# Patient Record
Sex: Female | Born: 1995 | Race: White | Hispanic: No | Marital: Single | Smoking: Never smoker
Health system: Southern US, Community
[De-identification: ages and names within clinical notes are randomized; demographics above are authoritative.]

## PROBLEM LIST (undated history)

## (undated) DIAGNOSIS — Z87442 Personal history of urinary calculi: Secondary | ICD-10-CM

## (undated) DIAGNOSIS — E119 Type 2 diabetes mellitus without complications: Secondary | ICD-10-CM

## (undated) HISTORY — PX: HERNIA REPAIR: SHX51

## (undated) HISTORY — PX: TYMPANOSTOMY TUBE PLACEMENT: SHX32

---
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
## 2018-01-04 ENCOUNTER — Other Ambulatory Visit: Payer: Self-pay | Admitting: Orthopedic Surgery

## 2018-01-04 DIAGNOSIS — M25552 Pain in left hip: Secondary | ICD-10-CM

## 2018-01-04 DIAGNOSIS — G8929 Other chronic pain: Secondary | ICD-10-CM

## 2018-01-04 DIAGNOSIS — M25551 Pain in right hip: Secondary | ICD-10-CM

## 2018-01-04 DIAGNOSIS — M25562 Pain in left knee: Secondary | ICD-10-CM

## 2018-01-04 DIAGNOSIS — M25561 Pain in right knee: Secondary | ICD-10-CM

## 2018-01-29 ENCOUNTER — Ambulatory Visit
Admission: RE | Admit: 2018-01-29 | Discharge: 2018-01-29 | Disposition: A | Discharge status: Home / Self Care | Payer: Medicaid Other | Source: Ambulatory Visit | Attending: Orthopedic Surgery | Admitting: Orthopedic Surgery

## 2018-01-29 ENCOUNTER — Encounter: Payer: Self-pay | Admitting: Radiology

## 2018-01-29 DIAGNOSIS — M25551 Pain in right hip: Secondary | ICD-10-CM | POA: Insufficient documentation

## 2018-01-29 DIAGNOSIS — M25552 Pain in left hip: Secondary | ICD-10-CM | POA: Diagnosis not present

## 2018-01-29 DIAGNOSIS — M25561 Pain in right knee: Secondary | ICD-10-CM

## 2018-01-29 DIAGNOSIS — G8929 Other chronic pain: Secondary | ICD-10-CM

## 2018-01-29 DIAGNOSIS — M25562 Pain in left knee: Secondary | ICD-10-CM

## 2018-01-29 DIAGNOSIS — M7121 Synovial cyst of popliteal space [Baker], right knee: Secondary | ICD-10-CM | POA: Insufficient documentation

## 2018-03-03 ENCOUNTER — Ambulatory Visit
Admission: RE | Admit: 2018-03-03 | Discharge: 2018-03-03 | Disposition: A | Discharge status: Home / Self Care | Payer: Medicaid Other | Source: Ambulatory Visit | Attending: Orthopedic Surgery | Admitting: Orthopedic Surgery

## 2018-03-03 ENCOUNTER — Other Ambulatory Visit: Payer: Self-pay | Admitting: Orthopedic Surgery

## 2018-03-03 DIAGNOSIS — M25561 Pain in right knee: Secondary | ICD-10-CM | POA: Insufficient documentation

## 2018-03-03 DIAGNOSIS — M2390 Unspecified internal derangement of unspecified knee: Secondary | ICD-10-CM

## 2018-03-03 DIAGNOSIS — M25562 Pain in left knee: Secondary | ICD-10-CM | POA: Diagnosis present

## 2018-03-09 ENCOUNTER — Encounter
Admission: RE | Admit: 2018-03-09 | Discharge: 2018-03-09 | Disposition: A | Discharge status: Home / Self Care | Payer: Medicaid Other | Source: Ambulatory Visit | Attending: Orthopedic Surgery | Admitting: Orthopedic Surgery

## 2018-03-09 ENCOUNTER — Other Ambulatory Visit: Payer: Self-pay

## 2018-03-09 HISTORY — DX: Type 2 diabetes mellitus without complications: E11.9

## 2018-03-09 HISTORY — DX: Personal history of urinary calculi: Z87.442

## 2018-03-09 NOTE — Patient Instructions (Signed)
Your procedure is scheduled on: 03-15-18 MONDAY Report to Same Day Surgery 2nd floor medical mall [facility](Medical Mall Entrance-take elevator on left to 2nd floor.  Check in with surgery information desk.) To find out your arrival time please call [phone] between 1PM - 3PM on 03-12-18 FRIDAY  Remember: Instructions that are not followed completely may result in serious medical risk, up to and including death, or upon the discretion of your surgeon and anesthesiologist your surgery may need to be rescheduled.    _x___ 1. Do not eat food after midnight the night before your procedure. You may drink WATER up to 2 hours before you are scheduled to arrive at the hospital for your procedure.  Do not drink WATER within 2 hours of your scheduled arrival to the hospital.  Type 1 and type 2 diabetics should only drink water.    __x__ 2. No Alcohol for 24 hours before or after surgery.   __x__3. No Smoking or e-cigarettes for 24 prior to surgery.  Do not use any chewable tobacco products for at least 6 hour prior to surgery   ____  4. Bring all medications with you on the day of surgery if instructed.    __x__ 5. Notify your doctor if there is any change in your medical condition     (cold, fever, infections).    x___6. On the morning of surgery brush your teeth with toothpaste and water.  You may rinse your mouth with mouth wash if you wish.  Do not swallow any toothpaste or mouthwash.   Do not wear jewelry, make-up, hairpins, clips or nail polish.  Do not wear lotions, powders, or perfumes. You may wear deodorant.  Do not shave 48 hours prior to surgery. Men may shave face and neck.  Do not bring valuables to the hospital.    [facility] not responsible for any belongings or valuables.               Contacts, dentures or bridgework may not be worn into surgery.  Leave your suitcase in the car. After surgery it may be brought to your room.  For patients admitted to the hospital, discharge time  is determined by your treatment team.  _  Patients discharged the day of surgery will not be allowed to drive home.  You will need someone to drive you home and stay with you the night of your procedure.    Please read over the following fact sheets that you were given:   [facility] Preparing for [facility] and or MRSA Information   ____ Take anti-hypertensive listed below, cardiac, seizure, asthma, anti-reflux and psychiatric medicines. These include:  1. NONE  2.  3.  4.  5.  6.  ____Fleets enema or Magnesium Citrate as directed.   _x___ Use CHG Soap or sage wipes as directed on instruction sheet   ____ Use inhalers on the day of surgery and bring to hospital day of surgery  _X___ Stop Metformin 2 days prior to surgery-LAST DOSE ON Friday, 03-12-18    ____ Take 1/2 of usual insulin dose the night before surgery and none on the morning surgery.   ____ Follow recommendations from Cardiologist, Pulmonologist or PCP regarding  stopping Aspirin, Coumadin, Plavix ,Eliquis, Effient, or Pradaxa, and Pletal.  X____Stop Anti-inflammatories such as Advil, Aleve, IBUPROFEN, Motrin, Naproxen, Naprosyn, Goodies powders or aspirin products NOW-OK to take Tylenol    ____ Stop supplements until after surgery.     ____ Bring C-Pap to  the hospital.

## 2018-03-10 ENCOUNTER — Encounter
Admission: RE | Admit: 2018-03-10 | Discharge: 2018-03-10 | Disposition: A | Discharge status: Home / Self Care | Payer: Medicaid Other | Source: Ambulatory Visit | Attending: Orthopedic Surgery | Admitting: Orthopedic Surgery

## 2018-03-10 DIAGNOSIS — Z01812 Encounter for preprocedural laboratory examination: Secondary | ICD-10-CM | POA: Insufficient documentation

## 2018-03-10 LAB — BASIC METABOLIC PANEL
ANION GAP: 11 (ref 5–15)
BUN: 12 mg/dL (ref 6–20)
CALCIUM: 9.3 mg/dL (ref 8.9–10.3)
CO2: 20 mmol/L — ABNORMAL LOW (ref 22–32)
Chloride: 107 mmol/L (ref 101–111)
Creatinine, Ser: 0.73 mg/dL (ref 0.44–1.00)
GFR calc Af Amer: >60 mL/min (ref >60)
Glucose, Bld: 88 mg/dL (ref 65–99)
POTASSIUM: 4.7 mmol/L (ref 3.5–5.1)
SODIUM: 138 mmol/L (ref 135–145)

## 2018-03-14 MED ORDER — CEFAZOLIN SODIUM-DEXTROSE 2-4 GM/100ML-% IV SOLN
2.0000 g | Freq: Once | INTRAVENOUS | Status: AC
  Administered 2018-03-15: 2 g via INTRAVENOUS

## 2018-03-15 ENCOUNTER — Ambulatory Visit: Payer: Medicaid Other | Admitting: Certified Registered"

## 2018-03-15 ENCOUNTER — Ambulatory Visit: Payer: Medicaid Other

## 2018-03-15 ENCOUNTER — Encounter
Admission: RE | Disposition: A | Discharge status: Home / Self Care | Payer: Self-pay | Source: Ambulatory Visit | Attending: Orthopedic Surgery

## 2018-03-15 ENCOUNTER — Ambulatory Visit
Admission: RE | Admit: 2018-03-15 | Discharge: 2018-03-15 | Disposition: A | Discharge status: Home / Self Care | Payer: Medicaid Other | Source: Ambulatory Visit | Attending: Orthopedic Surgery | Admitting: Orthopedic Surgery

## 2018-03-15 ENCOUNTER — Encounter: Payer: Self-pay | Admitting: Anesthesiology

## 2018-03-15 DIAGNOSIS — Z419 Encounter for procedure for purposes other than remedying health state, unspecified: Secondary | ICD-10-CM

## 2018-03-15 DIAGNOSIS — M60262 Foreign body granuloma of soft tissue, not elsewhere classified, left lower leg: Secondary | ICD-10-CM | POA: Diagnosis not present

## 2018-03-15 DIAGNOSIS — E119 Type 2 diabetes mellitus without complications: Secondary | ICD-10-CM | POA: Insufficient documentation

## 2018-03-15 HISTORY — PX: INCISION AND DRAINAGE WOUND WITH FOREIGN BODY REMOVAL: SHX5635

## 2018-03-15 LAB — GLUCOSE, CAPILLARY
GLUCOSE-CAPILLARY: 80 mg/dL (ref 65–99)
Glucose-Capillary: 90 mg/dL (ref 65–99)

## 2018-03-15 LAB — POCT PREGNANCY, URINE: Preg Test, Ur: NEGATIVE

## 2018-03-15 SURGERY — INCISION AND DRAINAGE WOUND WITH FOREIGN BODY REMOVAL
Anesthesia: General | Site: Knee | Laterality: Left | Wound class: Clean

## 2018-03-15 MED ORDER — GLYCOPYRROLATE 0.2 MG/ML IJ SOLN
INTRAMUSCULAR | Status: DC | PRN
  Administered 2018-03-15: 0.2 mg via INTRAVENOUS

## 2018-03-15 MED ORDER — ONDANSETRON HCL 4 MG/2ML IJ SOLN: 4.0000 mg | Freq: Once | INTRAMUSCULAR | Status: DC | PRN

## 2018-03-15 MED ORDER — FAMOTIDINE 20 MG PO TABS
20.0000 mg | ORAL_TABLET | Freq: Once | ORAL | Status: AC
  Administered 2018-03-15: 20 mg via ORAL

## 2018-03-15 MED ORDER — LACTATED RINGERS IV SOLN
INTRAVENOUS | Status: DC | PRN
Start: 2018-03-15 — End: 2018-03-15
  Administered 2018-03-15 (×2): via INTRAVENOUS

## 2018-03-15 MED ORDER — MIDAZOLAM HCL 2 MG/2ML IJ SOLN
INTRAMUSCULAR | Status: AC
  Filled 2018-03-15: qty 2

## 2018-03-15 MED ORDER — ONDANSETRON 4 MG PO TBDP: 4.0000 mg | ORAL_TABLET | Freq: Three times a day (TID) | ORAL | 0 refills | Status: AC | PRN

## 2018-03-15 MED ORDER — MIDAZOLAM HCL 2 MG/2ML IJ SOLN
INTRAMUSCULAR | Status: DC | PRN
  Administered 2018-03-15: 2 mg via INTRAVENOUS

## 2018-03-15 MED ORDER — KETOROLAC TROMETHAMINE 30 MG/ML IJ SOLN
INTRAMUSCULAR | Status: DC | PRN
  Administered 2018-03-15: 30 mg via INTRAVENOUS

## 2018-03-15 MED ORDER — OXYCODONE HCL 5 MG PO TABS: 5.0000 mg | ORAL_TABLET | ORAL | 0 refills | Status: AC | PRN

## 2018-03-15 MED ORDER — IBUPROFEN 600 MG PO TABS: 600.0000 mg | ORAL_TABLET | Freq: Three times a day (TID) | ORAL | 0 refills | Status: AC | PRN

## 2018-03-15 MED ORDER — FENTANYL CITRATE (PF) 100 MCG/2ML IJ SOLN
INTRAMUSCULAR | Status: AC
  Filled 2018-03-15: qty 2

## 2018-03-15 MED ORDER — PROPOFOL 10 MG/ML IV BOLUS
INTRAVENOUS | Status: AC
  Filled 2018-03-15: qty 20

## 2018-03-15 MED ORDER — ONDANSETRON HCL 4 MG/2ML IJ SOLN
INTRAMUSCULAR | Status: DC | PRN
  Administered 2018-03-15: 4 mg via INTRAVENOUS

## 2018-03-15 MED ORDER — FAMOTIDINE 20 MG PO TABS
ORAL_TABLET | ORAL | Status: AC
  Administered 2018-03-15: 20 mg via ORAL
  Filled 2018-03-15: qty 1

## 2018-03-15 MED ORDER — HYDROMORPHONE HCL 1 MG/ML IJ SOLN
INTRAMUSCULAR | Status: DC | PRN
  Administered 2018-03-15: 1 mg via INTRAVENOUS

## 2018-03-15 MED ORDER — BUPIVACAINE-EPINEPHRINE (PF) 0.5% -1:200000 IJ SOLN
INTRAMUSCULAR | Status: DC | PRN
  Administered 2018-03-15: 5 mL

## 2018-03-15 MED ORDER — FENTANYL CITRATE (PF) 100 MCG/2ML IJ SOLN
25.0000 ug | INTRAMUSCULAR | Status: DC | PRN
  Administered 2018-03-15: 25 ug via INTRAVENOUS

## 2018-03-15 MED ORDER — ACETAMINOPHEN 500 MG PO TABS: 1000.0000 mg | ORAL_TABLET | Freq: Three times a day (TID) | ORAL | 0 refills | Status: AC

## 2018-03-15 MED ORDER — ASPIRIN EC 325 MG PO TBEC: 325.0000 mg | DELAYED_RELEASE_TABLET | Freq: Every day | ORAL | 0 refills | Status: AC

## 2018-03-15 MED ORDER — FENTANYL CITRATE (PF) 100 MCG/2ML IJ SOLN
INTRAMUSCULAR | Status: DC | PRN
  Administered 2018-03-15: 100 ug via INTRAVENOUS

## 2018-03-15 MED ORDER — LIDOCAINE HCL (CARDIAC) 20 MG/ML IV SOLN
INTRAVENOUS | Status: DC | PRN
  Administered 2018-03-15: 60 mg via INTRAVENOUS

## 2018-03-15 MED ORDER — SODIUM CHLORIDE 0.9 % IV SOLN
INTRAVENOUS | Status: DC
  Administered 2018-03-15: 11:00:00 via INTRAVENOUS

## 2018-03-15 MED ORDER — BUPIVACAINE-EPINEPHRINE (PF) 0.5% -1:200000 IJ SOLN
INTRAMUSCULAR | Status: AC
  Filled 2018-03-15: qty 30

## 2018-03-15 MED ORDER — HYDROMORPHONE HCL 1 MG/ML IJ SOLN
INTRAMUSCULAR | Status: AC
  Filled 2018-03-15: qty 1

## 2018-03-15 MED ORDER — PROPOFOL 10 MG/ML IV BOLUS
INTRAVENOUS | Status: DC | PRN
  Administered 2018-03-15: 50 mg via INTRAVENOUS
  Administered 2018-03-15: 150 mg via INTRAVENOUS

## 2018-03-15 MED ORDER — LIDOCAINE-EPINEPHRINE 1 %-1:100000 IJ SOLN
INTRAMUSCULAR | Status: AC
  Filled 2018-03-15: qty 1

## 2018-03-15 SURGICAL SUPPLY — 23 items
BANDAGE ELASTIC 6 LF NS (GAUZE/BANDAGES/DRESSINGS) ×2 IMPLANT
BNDG ESMARK 4X12 TAN STRL LF (GAUZE/BANDAGES/DRESSINGS) ×2 IMPLANT
CANISTER SUCT 1200ML W/VALVE (MISCELLANEOUS) ×2 IMPLANT
CHLORAPREP W/TINT 26ML (MISCELLANEOUS) ×2 IMPLANT
CUFF TOURN 30 STER DUAL PORT (MISCELLANEOUS) ×2 IMPLANT
DERMABOND ADVANCED (GAUZE/BANDAGES/DRESSINGS) ×1
DERMABOND ADVANCED .7 DNX12 (GAUZE/BANDAGES/DRESSINGS) ×1 IMPLANT
GLOVE BIO SURGEON STRL SZ8 (GLOVE) ×4 IMPLANT
GLOVE INDICATOR 8.0 STRL GRN (GLOVE) ×4 IMPLANT
GOWN STRL REUS W/ TWL LRG LVL3 (GOWN DISPOSABLE) ×1 IMPLANT
GOWN STRL REUS W/ TWL XL LVL3 (GOWN DISPOSABLE) ×1 IMPLANT
GOWN STRL REUS W/TWL LRG LVL3 (GOWN DISPOSABLE) ×1
GOWN STRL REUS W/TWL XL LVL3 (GOWN DISPOSABLE) ×1
KIT TURNOVER KIT A (KITS) ×2 IMPLANT
NS IRRIG 500ML POUR BTL (IV SOLUTION) ×2 IMPLANT
PACK EXTREMITY ARMC (MISCELLANEOUS) ×2 IMPLANT
PAD CAST CTTN 4X4 STRL (SOFTGOODS) ×1 IMPLANT
PADDING CAST COTTON 4X4 STRL (SOFTGOODS) ×1
STRAP SAFETY 5IN WIDE (MISCELLANEOUS) ×2 IMPLANT
SUT MNCRL AB 4-0 PS2 18 (SUTURE) ×2 IMPLANT
SUT MON AB 4-0 RB1 27 (SUTURE) ×2 IMPLANT
SUT VIC AB 3-0 SH 27 (SUTURE) ×1
SUT VIC AB 3-0 SH 27X BRD (SUTURE) ×1 IMPLANT

## 2018-03-15 NOTE — Anesthesia Postprocedure Evaluation (Signed)
Anesthesia Post Note  Patient: Tamara Kim  Procedure(s) Performed: FOREIGN BODY REMOVAL-LEFT KNEE (Left Knee)  Patient location during evaluation: PACU Anesthesia Type: General Level of consciousness: awake and alert Pain management: pain level controlled Vital Signs Assessment: post-procedure vital signs reviewed and stable Respiratory status: spontaneous breathing, nonlabored ventilation, respiratory function stable and patient connected to nasal cannula oxygen Cardiovascular status: blood pressure returned to baseline and stable Postop Assessment: no apparent nausea or vomiting Anesthetic complications: no     Last Vitals:  Vitals:   03/15/18 1510 03/15/18 1530  BP: 106/67 103/70  Pulse: 69   Resp: 10 14  Temp: 36.5 C 36.9 C  SpO2: 95% 98%    Last Pain:  Vitals:   03/15/18 1518  TempSrc:   PainSc: Asleep                 Kiel Cockerell S

## 2018-03-15 NOTE — Anesthesia Procedure Notes (Signed)
Procedure Name: LMA Insertion Date/Time: 03/15/2018 1:22 PM Performed by: Maryla MorrowFerencik, Kori Colin M., CRNA Pre-anesthesia Checklist: Patient identified, Patient being monitored, Timeout performed, Emergency Drugs available and Suction available Patient Re-evaluated:Patient Re-evaluated prior to induction Oxygen Delivery Method: Circle system utilized Preoxygenation: Pre-oxygenation with 100% oxygen Induction Type: IV induction Ventilation: Mask ventilation without difficulty LMA: LMA inserted LMA Size: 4.5 Tube type: Oral Number of attempts: 1 Placement Confirmation: positive ETCO2 and breath sounds checked- equal and bilateral Tube secured with: Tape Dental Injury: Teeth and Oropharynx as per pre-operative assessment

## 2018-03-15 NOTE — Transfer of Care (Signed)
Immediate Anesthesia Transfer of Care Note  Patient: Tamara Kim  Procedure(s) Performed: FOREIGN BODY REMOVAL-LEFT KNEE (Left Knee)  Patient Location: PACU  Anesthesia Type:General  Level of Consciousness: awake, alert  and oriented  Airway & Oxygen Therapy: Patient Spontanous Breathing  Post-op Assessment: Report given to RN and Post -op Vital signs reviewed and stable  Post vital signs: Reviewed and stable  Last Vitals:  Vitals:   03/15/18 1113  BP: 116/74  Pulse: 81  Resp: 17  Temp: 36.8 C  SpO2: 100%    Last Pain:  Vitals:   03/15/18 1113  TempSrc: Oral  PainSc: 5          Complications: No apparent anesthesia complications
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
## 2018-03-15 NOTE — OR Nursing (Signed)
Discussed discharge instructions with pt and mom. Both voice understanding 

## 2018-03-15 NOTE — Anesthesia Preprocedure Evaluation (Signed)
Anesthesia Evaluation  Patient identified by MRN, date of birth, ID band Patient awake    Reviewed: Allergy & Precautions, NPO status , Patient's Chart, lab work & pertinent test results, reviewed documented beta blocker date and time   Airway Mallampati: II  TM Distance: >3 FB     Dental  (+) Chipped   Pulmonary           Cardiovascular      Neuro/Psych    GI/Hepatic   Endo/Other  diabetes, Type 2  Renal/GU      Musculoskeletal   Abdominal   Peds  Hematology   Anesthesia Other Findings   Reproductive/Obstetrics                             Anesthesia Physical Anesthesia Plan  ASA: II  Anesthesia Plan: General   Post-op Pain Management:    Induction: Intravenous  PONV Risk Score and Plan:   Airway Management Planned: LMA  Additional Equipment:   Intra-op Plan:   Post-operative Plan:   Informed Consent: I have reviewed the patients History and Physical, chart, labs and discussed the procedure including the risks, benefits and alternatives for the proposed anesthesia with the patient or authorized representative who has indicated his/her understanding and acceptance.     Plan Discussed with: CRNA  Anesthesia Plan Comments:         Anesthesia Quick Evaluation

## 2018-03-15 NOTE — Anesthesia Post-op Follow-up Note (Signed)
Anesthesia QCDR form completed.        

## 2018-03-15 NOTE — Discharge Instructions (Signed)
Knee Surgery  Post-Op Instructions  1. Bracing or crutches: Crutches will be provided at the time of discharge from the surgery center if you need them and do not already have them.  2. Ice: You may be provided with a device [facility](Polar Care) that allows you to ice the affected area effectively. Otherwise you can ice manually.   3. Driving:  Plan on not driving for at least one week. Please note that you are advised NOT to drive while taking narcotic pain medications as you may be impaired and unsafe to drive.  4. Activity: Ankle pumps several times an hour while awake to prevent blood clots. Weight bearing: as tolerated. Use crutches for as needed (usually ~3 days or less) until pain allows you to ambulate without a limp. Bending and straightening the knee is unlimited. Elevate knee above heart level as much as possible for one week. Avoid standing more than 5 minutes (consecutively) for the first few days.  Avoid long distance travel for 1 wee.  5. Medications:  - You have been provided a prescription for narcotic pain medicine. After surgery, take 1 narcotic tablet every 4-6 hours as needed. You should not need this for more than 1-2 days. - A prescription for anti-nausea medication will be provided in case the narcotic medicine causes nausea - take 1 tablet every 6 hours only if nauseated.  - Take ibuprofen 600 mg every 8 hours WITH food to reduce post-operative pain and swelling. Please discontinue if you have any abdominal discomfort after taking this.  - Take Tylenol (acetaminophen) 1000mg  q8h until you no longer have pain. - Take enteric coated aspirin 325 mg once daily for 2 weeks to prevent blood clots.   6. Bandages: The physical therapist should change the bandages at the first post-op appointment. If needed, the dressing supplies have been provided to you.  7. Physical Therapy: 1-2 times per week for 6 weeks. Therapy typically starts on post operative Day 3 or 4. You have been  provided an order for physical therapy. The therapist will provide home exercises.  8. Work: May return to full work usually around 2 weeks after 1st post-operative visit. May do light duty/desk job in approximately 1-2 weeks when off of narcotics, pain is well-controlled, and swelling has decreased. Labor intensive jobs may require 4-6 weeks to return.    9. Post-Op Appointments: Your first post-op appointment will be with Dr. Allena KatzPatel in approximately 2 weeks time.   If you find that they have not been scheduled please call the Orthopaedic Appointment front desk at [phone].    AMBULATORY SURGERY  DISCHARGE INSTRUCTIONS   1) The drugs that you were given will stay in your system until tomorrow so for the next 24 hours you should not:  A) Drive an automobile B) Make any legal decisions C) Drink any alcoholic beverage   2) You may resume regular meals tomorrow.  Today it is better to start with liquids and gradually work up to solid foods.  You may eat anything you prefer, but it is better to start with liquids, then soup and crackers, and gradually work up to solid foods.   3) Please notify your doctor immediately if you have any unusual bleeding, trouble breathing, redness and pain at the surgery site, drainage, fever, or pain not relieved by medication.    4) Additional Instructions:        Please contact your physician with any problems or Same Day Surgery at [phone], Monday through Friday 6  am to 4 pm, or [place] [facility] number at [phone].

## 2018-03-16 ENCOUNTER — Encounter: Payer: Self-pay | Admitting: Orthopedic Surgery

## 2018-03-17 LAB — SURGICAL PATHOLOGY

## 2019-11-24 IMAGING — MR MR KNEE*R* W/O CM
6 series · 38 of 40 positions shown · non-contrast
Comparison: None.

CLINICAL DATA: Chronic right knee pain.

EXAM:
MRI OF THE RIGHT KNEE WITHOUT CONTRAST
TECHNIQUE: Multiplanar, multisequence MR imaging of the knee was performed. No
intravenous contrast was administered.

[Series 3: PD fat-sat · axial · 3.0mm · 0.50mm/px · z∈[-67,+45]mm · 8 of 35 slices shown (1 of 4)]
[im 1/35]
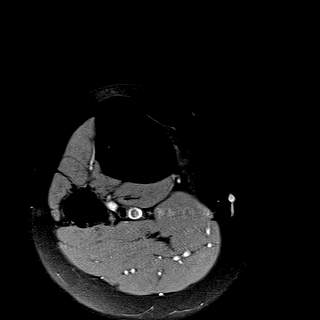
[im 5/35]
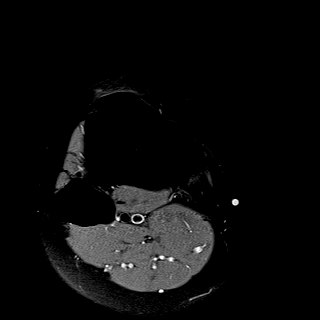
[im 10/35]
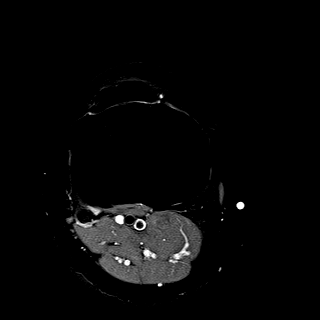
[im 15/35]
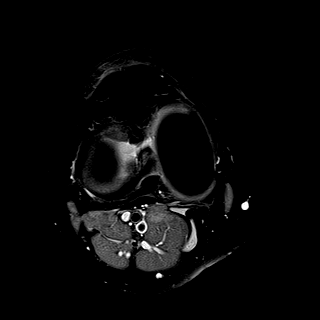
[im 20/35]
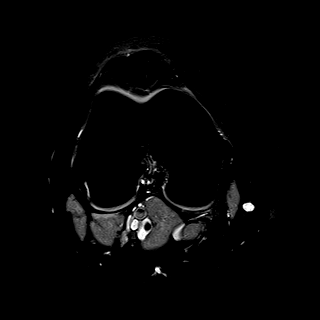
[im 25/35]
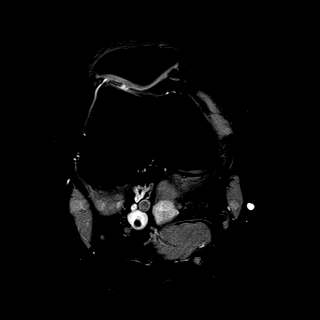
[im 30/35]
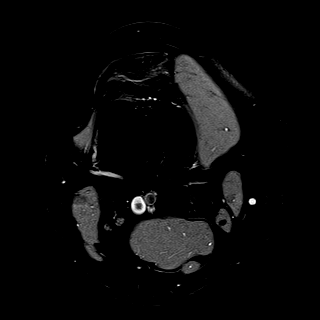
[im 35/35]
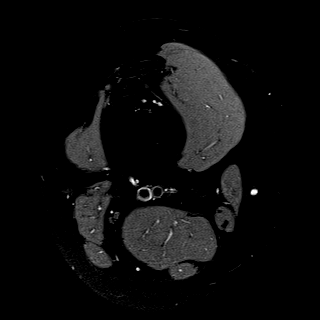

[Series 4: T1 · coronal · 3.0mm · 0.50mm/px · 5 of 29 slices shown]
[im 1/29]
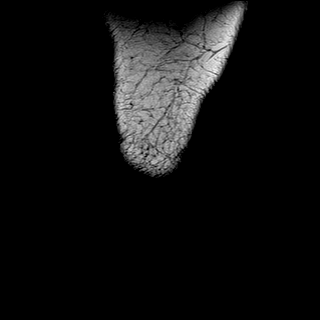
[im 5/29]
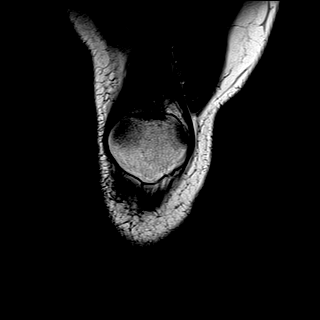
[im 10/29]
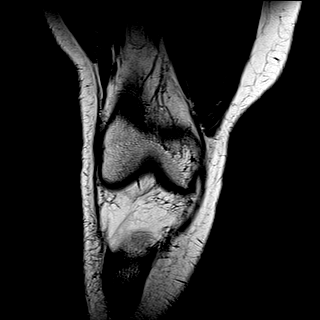
[im 15/29]
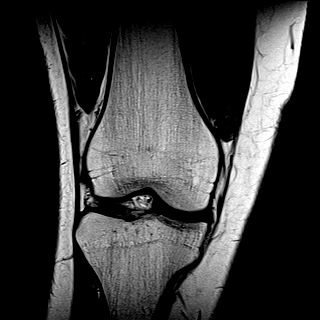
[im 19/29]
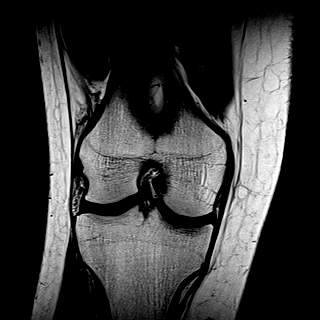

[Series 5: T2 fat-sat · coronal · 3.0mm · 0.31mm/px · 7 of 29 slices shown]
[im 1/29]
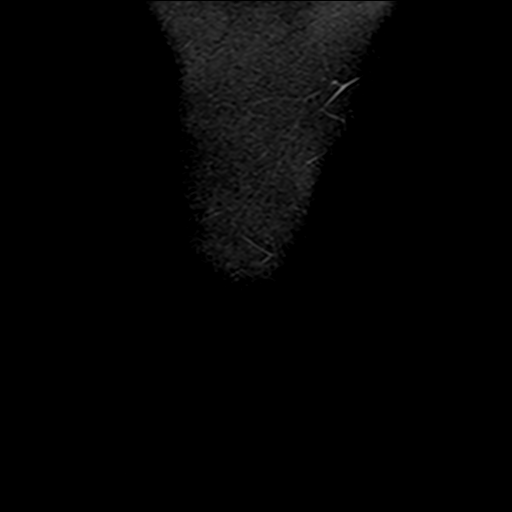
[im 5/29]
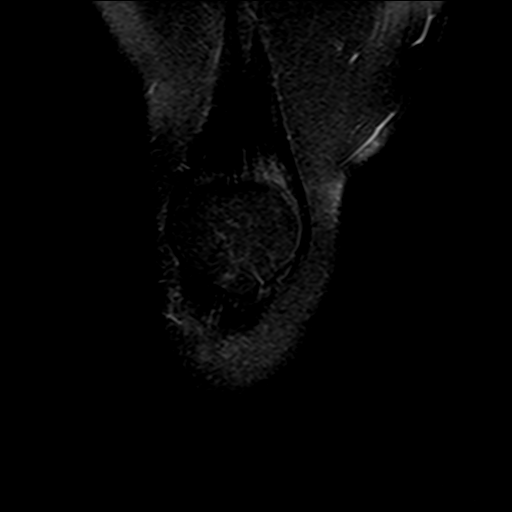
[im 10/29]
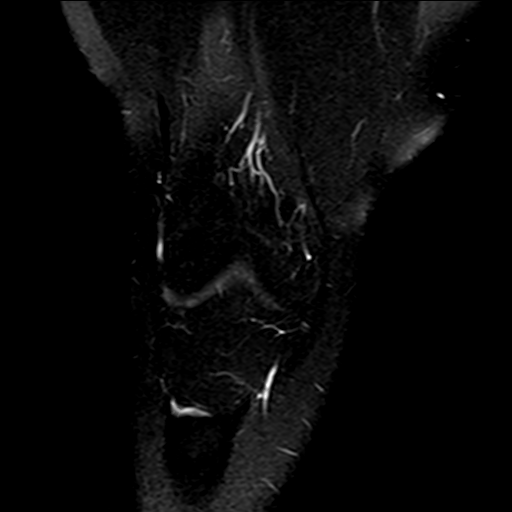
[im 15/29]
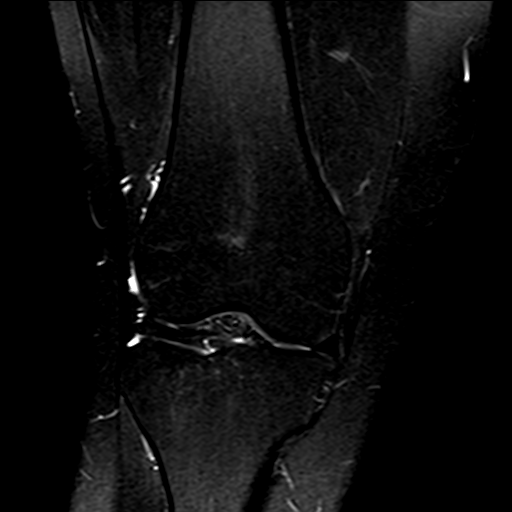
[im 19/29]
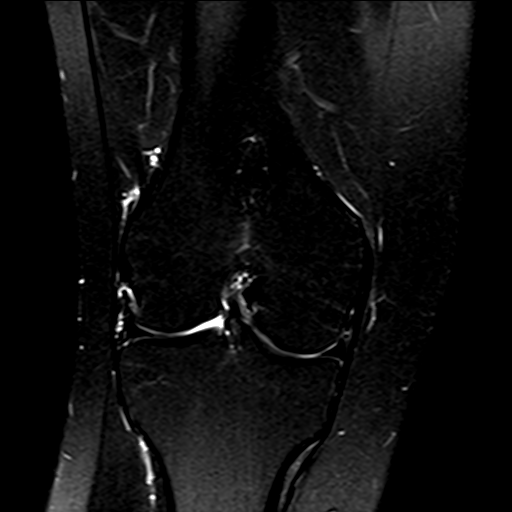
[im 24/29]
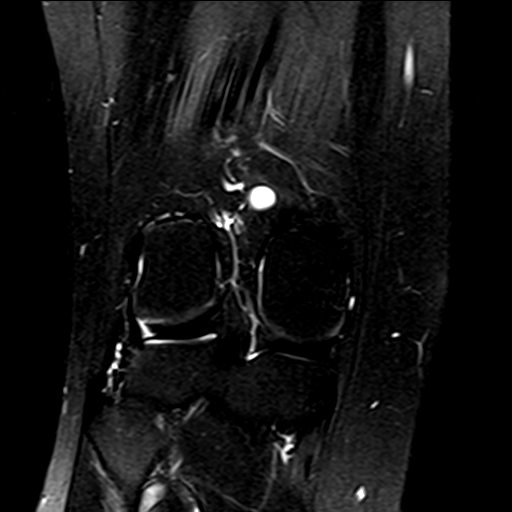
[im 29/29]
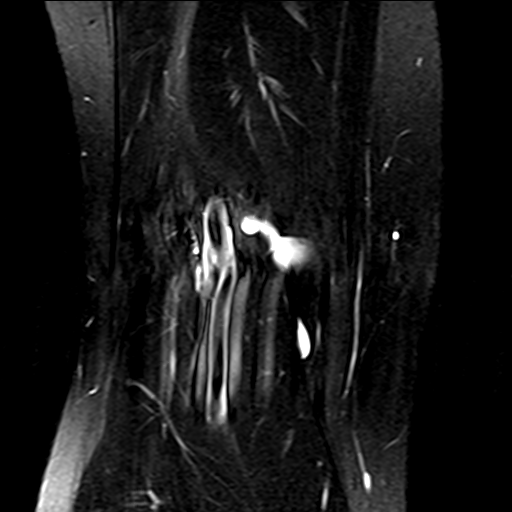

[Series 6: PD fat-sat · sagittal · 3.0mm · 0.50mm/px · 7 of 30 slices shown (2 of 4)]
[im 1/30]
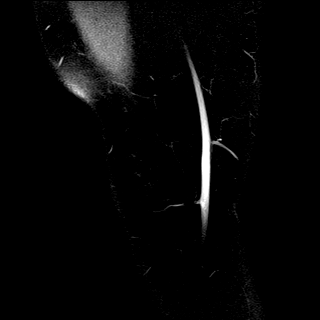
[im 5/30]
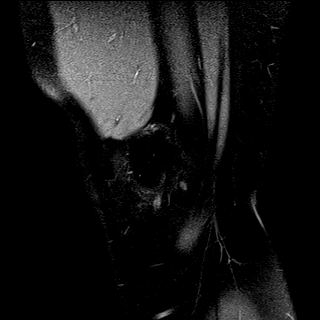
[im 10/30]
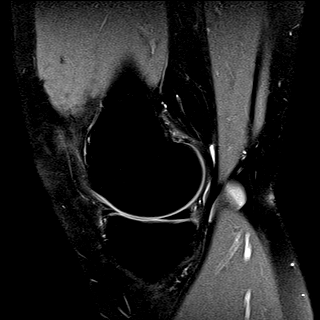
[im 15/30]
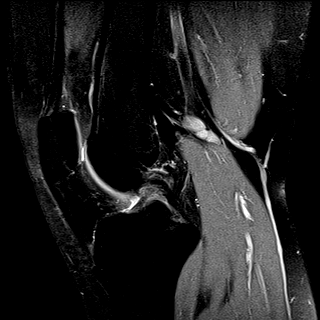
[im 20/30]
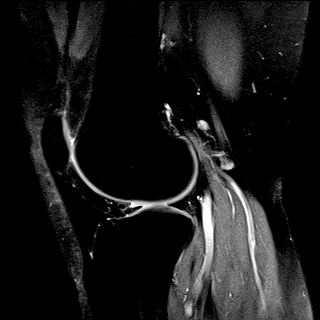
[im 25/30]
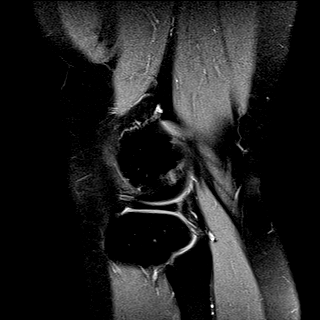
[im 30/30]
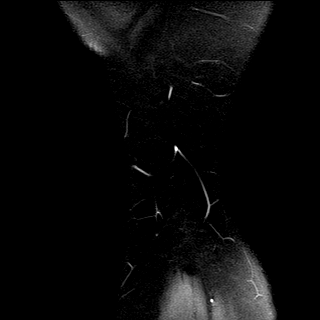

[Series 7: PD fat-sat · coronal · 3.0mm · 0.50mm/px · 7 of 29 slices shown (3 of 4)]
[im 1/29]
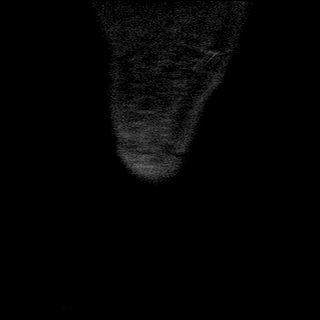
[im 5/29]
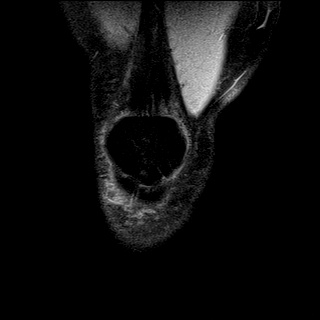
[im 10/29]
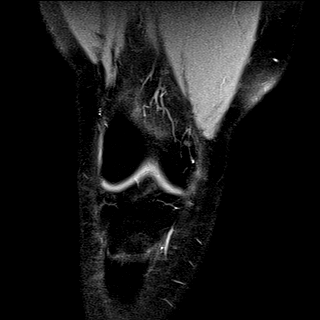
[im 15/29]
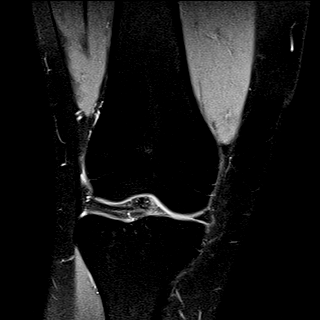
[im 19/29]
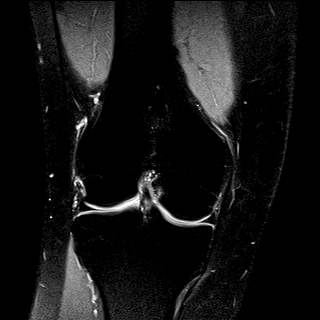
[im 24/29]
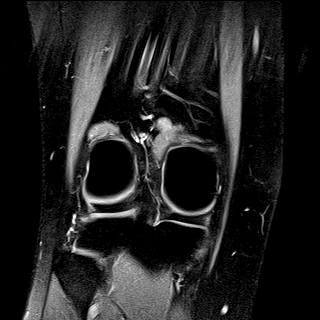
[im 29/29]
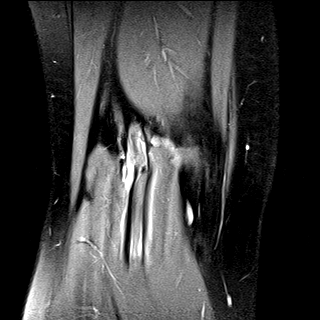

[Series 8: PD fat-sat · oblique · 2.0mm · 0.62mm/px · 4 of 17 slices shown (4 of 4)]
[im 1/17]
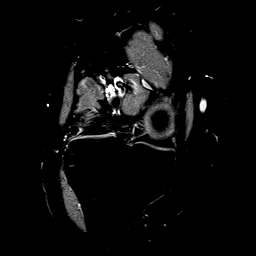
[im 6/17]
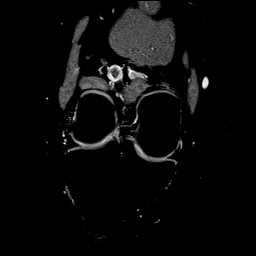
[im 11/17]
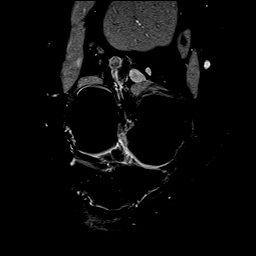
[im 17/17]
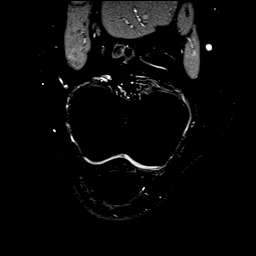

[38 of 40 positions shown; findings below may reference images not displayed]

FINDINGS: MENISCI

Medial meniscus:  Normal.

Lateral meniscus:  Normal.

LIGAMENTS

Cruciates:  Normal.

Collaterals:  Normal.

CARTILAGE

Patellofemoral:  Normal.

Medial:  Normal.

Lateral:  Normal.

Joint:  No joint effusion. Normal Hoffa's fat. No plical thickening.

Popliteal Fossa:  Small Baker's cyst.  Intact popliteus tendon.

Extensor Mechanism:  Normal.

Bones:  Normal.

Other: None
IMPRESSION: Small Baker's cyst.  Otherwise, normal MRI of the right knee.

## 2023-01-11 ENCOUNTER — Ambulatory Visit
Admission: EM | Admit: 2023-01-11 | Discharge: 2023-01-11 | Disposition: A | Discharge status: Home / Self Care | Payer: Medicaid Other | Attending: Physician Assistant | Admitting: Physician Assistant

## 2023-01-11 LAB — GLUCOSE, CAPILLARY: Glucose-Capillary: 119 mg/dL — ABNORMAL HIGH (ref 70–99)

## 2023-01-11 NOTE — ED Notes (Signed)
Patient is being discharged from the Urgent Care and sent to the [facility] Emergency Department via private vehicle with mother . Per Verna Czech, PA, patient is in need of higher level of care due to worsening kidney symptoms and no better on oral antibiotic. Patient is aware and verbalizes understanding of plan of care.  Vitals:   01/11/23 1322  BP: (!) 124/102  Pulse: 87  Resp: 14  Temp: 97.9 F (36.6 C)  SpO2: 100%

## 2023-01-11 NOTE — ED Provider Notes (Signed)
I was called into triage to evaluate patient by nursing staff.  Reports that several days ago she was diagnosed with a UTI and started on Cipro as well as prednisone taper.  She does have a history of diabetes but is able to manage this with diet and exercise alone.  She has been monitoring her blood sugars and these have been normal around 100.  Reports that over the past several days she started feeling worse with significant lower abdominal pain that is rated 7/8 on a 0-10 pain scale, heart racing, extreme lethargy, generalized weakness, intermittent fevers.  On exam she had tenderness palpation in lower abdomen.  Given her abdominal pain recommend she go to the emergency room for further evaluation and management as we do not have imaging capabilities in urgent care today.  She is agreeable to this and will go directly to ER.  Vitals were stable at the time of discharge and her blood sugar was 119.  Patient will have her family member transport her to [facility].   Terrilee Croak, PA-C 01/11/23 1330

## 2023-01-11 NOTE — ED Triage Notes (Signed)
Patient states that she had COVID last month.  Patient has history of kidney infection on Wed.  Patient states that she is currently on oral antibiotic.  Mother states that she is not feeling any better.  Patient states that she has has had abdominal pain, weakness, and tachycardia over that past couple of days.  Patient unsure of fevers. Patient states that she is diabetic.

## 2024-04-28 NOTE — Progress Notes (Deleted)
 New OB Intake  I connected with  Tamara Kim on 04/28/24 at  1:15 PM EDT by {Contact:24193} Video Visit and verified that I am speaking with the correct person using two identifiers. Nurse is located at [facility] and pt is located at [employer]  I discussed the limitations, risks, security and privacy concerns of performing an evaluation and management service by telephone and the availability of in person appointments. I also discussed with the patient that there may be a patient responsible charge related to this service. The patient expressed understanding and agreed to proceed.  I explained I am completing New OB Intake today. We discussed her EDD of *** that is based on LMP of ***. Pt is G***/P***. I reviewed her allergies, medications, Medical/Surgical/OB history, and appropriate screenings. There are cats in the home  {yes/no:63} If yes {Desc; indoor/outdoor:13239}. Based on history, this is a/an pregnancy {Complicated/Uncomplicated Pregnancy:20185} . Her obstetrical history is significant for {ob risk factors:10154}.  There are no active problems to display for this patient.   Concerns addressed today:   Delivery Plans:  Plans to deliver at [facility].  Anatomy US  Explained first scheduled US  will be around 19 weeks. Anatomy US  scheduled for *** at ***. Pt notified to arrive at ***.  Labs Discussed genetic screening with patient. Patient *** genetic testing to be drawn at new OB visit. Discussed possible labs to be drawn at new OB appointment.  COVID Vaccine Patient {HAS/HAS NOT:20194} had COVID vaccine.   Social Determinants of Health Food Insecurity: {QIHK7:42595} WIC Referral: Patient {ACTION; IS/IS GLO:75643329} interested in referral to [facility].  Transportation: {transportation:25542} Childcare: Discussed no children allowed at ultrasound appointments.   First visit review I reviewed new OB appt with pt. I explained she will have blood work and pap  smear/pelvic exam if indicated. Explained pt will be seen by *** at first visit; encounter routed to appropriate provider.   Higinio Love, [facility] 04/28/2024  4:31 PM

## 2024-04-29 ENCOUNTER — Telehealth: Payer: Self-pay

## 2024-04-29 ENCOUNTER — Telehealth: Payer: Self-pay | Admitting: Certified Nurse Midwife

## 2024-04-29 ENCOUNTER — Telehealth

## 2024-04-29 NOTE — Telephone Encounter (Signed)
 Reached out to pt to reschedule NOB Intake appt (via MyChart) that was scheduled on 04/29/2024 at 1:15.  Left message for pt to call back.

## 2024-04-29 NOTE — Telephone Encounter (Signed)
 Pt did not log in to mychart visit. Called pt twice and left a voice msg to return our phone call for NOB intake. I also called her emergency contact which is her mom, no answer. Did not leave voice msg.

## 2024-05-02 ENCOUNTER — Encounter: Payer: Self-pay | Admitting: Certified Nurse Midwife

## 2024-05-02 NOTE — Telephone Encounter (Signed)
 Reached out to pt (2x) to reschedule NOB intake appt (via MyChart) that was scheduled on 04/29/2024 at 1:!5.  Left message for pt to call back to reschedule.  Will send a MyChart letter to pt.

## 2024-05-05 ENCOUNTER — Telehealth: Payer: Self-pay | Admitting: Certified Nurse Midwife

## 2024-05-05 ENCOUNTER — Other Ambulatory Visit

## 2024-05-05 NOTE — Telephone Encounter (Signed)
 Reached out to pt about dating scan that was scheduled on 05/05/2024 at 4:00.  Pt will need to be given the number for Centralized Scheduling so she can reschedule.

## 2024-05-06 ENCOUNTER — Encounter: Payer: Self-pay | Admitting: Certified Nurse Midwife

## 2024-05-06 NOTE — Telephone Encounter (Signed)
 Reached out to pt (2x) about dating scan that was scheduled on 05/05/2024 at 4:00.  Pt will need to be given the number for Centralized Scheduling so she can reschedule.  Will send a MyChart letter to pt.

## 2024-05-18 ENCOUNTER — Encounter: Admitting: Certified Nurse Midwife

## 2024-05-18 ENCOUNTER — Telehealth: Payer: Self-pay | Admitting: Certified Nurse Midwife

## 2024-05-18 NOTE — Telephone Encounter (Signed)
 Reached out to pt to reschedule NOB appt that was scheduled on 5/21/205 at 2:55 with J. Irine Manning.  Left message for pt to call back to reschedule.

## 2024-05-19 ENCOUNTER — Encounter: Payer: Self-pay | Admitting: Certified Nurse Midwife

## 2024-05-19 NOTE — Telephone Encounter (Signed)
 Reached out to pt (2x) to reschedule NOB appt that was scheduled on 5/21/025 at2:55 with Binnie Buffalo.  Left message for pt to call back to reschedule.  Will send a MyChart letter to pt.
